# Patient Record
Sex: Female | Born: 1990 | Race: Black or African American | Hispanic: No | Marital: Single | Smoking: Never smoker
Health system: Southern US, Community
[De-identification: ages and names within clinical notes are randomized; demographics above are authoritative.]

## PROBLEM LIST (undated history)

## (undated) DIAGNOSIS — T7840XA Allergy, unspecified, initial encounter: Secondary | ICD-10-CM

## (undated) HISTORY — DX: Allergy, unspecified, initial encounter: T78.40XA

---
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
## 2009-12-18 ENCOUNTER — Ambulatory Visit (HOSPITAL_COMMUNITY): Admission: RE | Admit: 2009-12-18 | Discharge: 2009-12-18 | Payer: Self-pay | Admitting: Family Medicine

## 2011-07-20 IMAGING — US US PELVIS COMPLETE
1 series · 14 of 25 positions shown · non-contrast
Comparison: None.

CLINICAL DATA: 19-year-old patient with irregular and heavy menses.
An attempt at transvaginal imaging was performed, but the patient
reported that she is not sexually active, and as the probe could
not be easily inserted, it was removed.

TRANSABDOMINAL ULTRASOUND OF PELVIS
TECHNIQUE: Transabdominal ultrasound examination of the pelvis was
performed including evaluation of the uterus, ovaries, adnexal
regions, and pelvic cul-de-sac.

[Series 1: us transvaginal non-ob · 0.21mm/px · 14 of 29 slices shown]
[im 1/29]
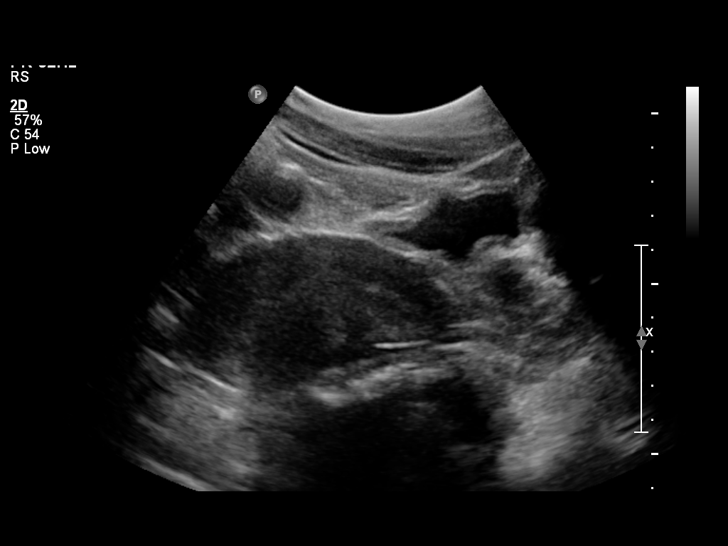
[im 3/29]
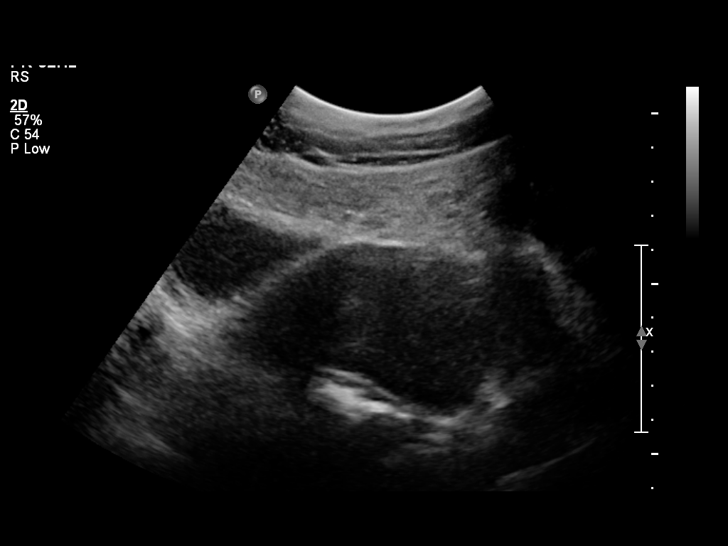
[im 5/29]
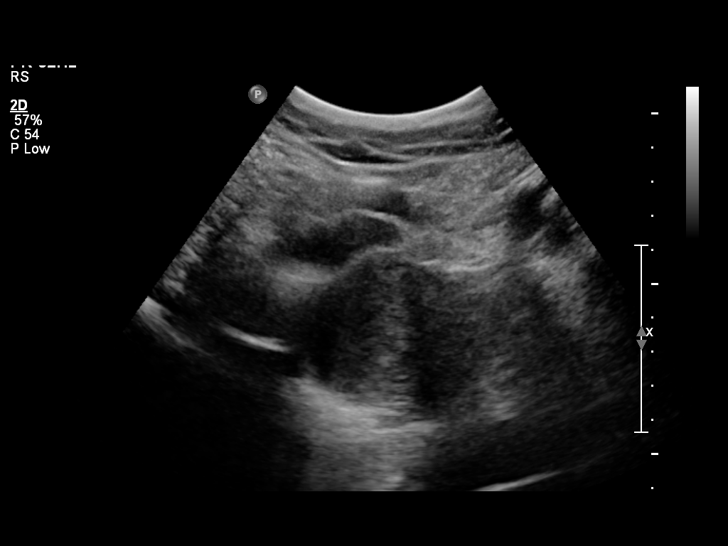
[im 8/29]
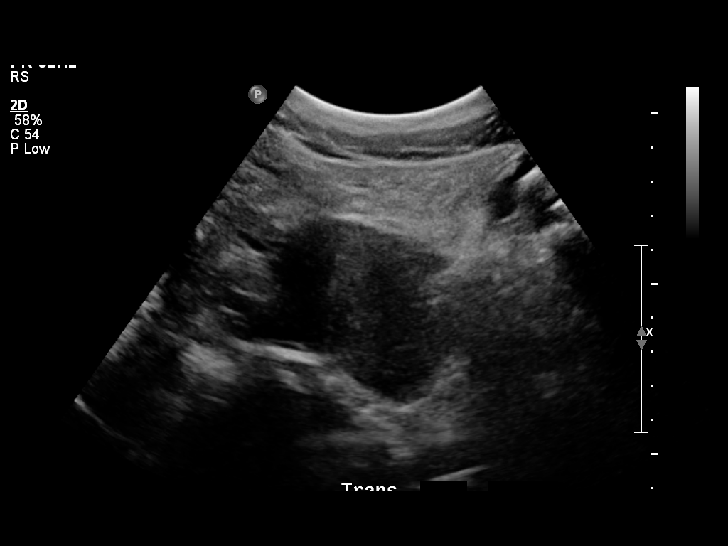
[im 10/29]
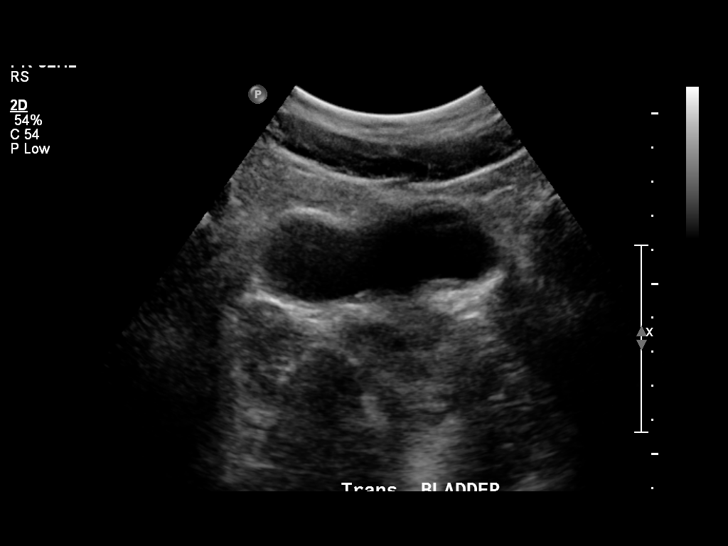
[im 11/29]
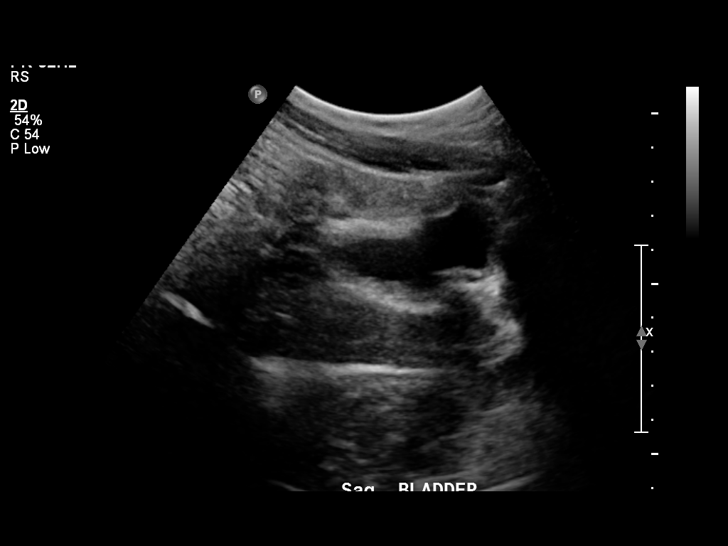
[im 13/29]
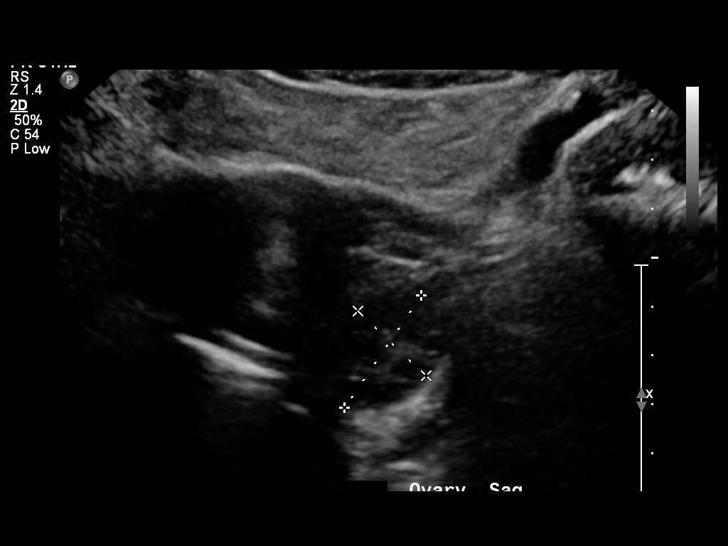
[im 16/29]
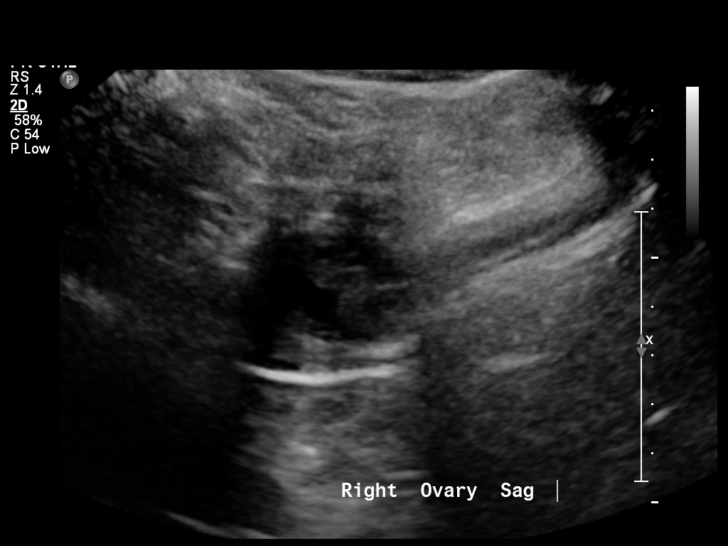
[im 18/29]
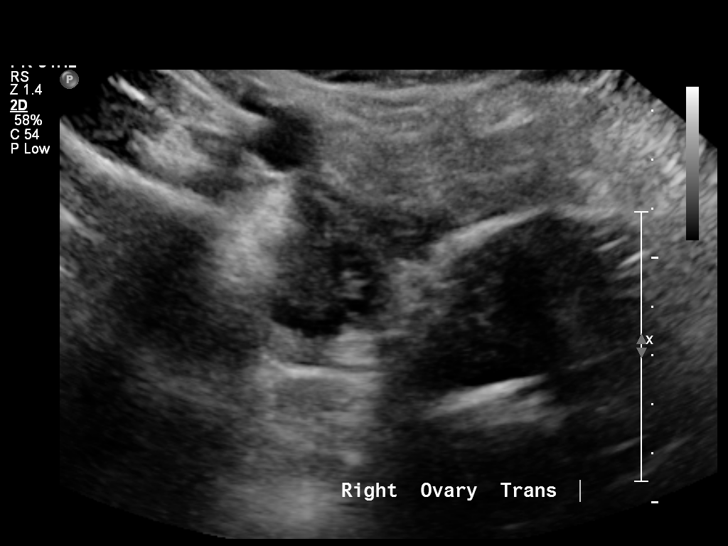
[im 19/29]
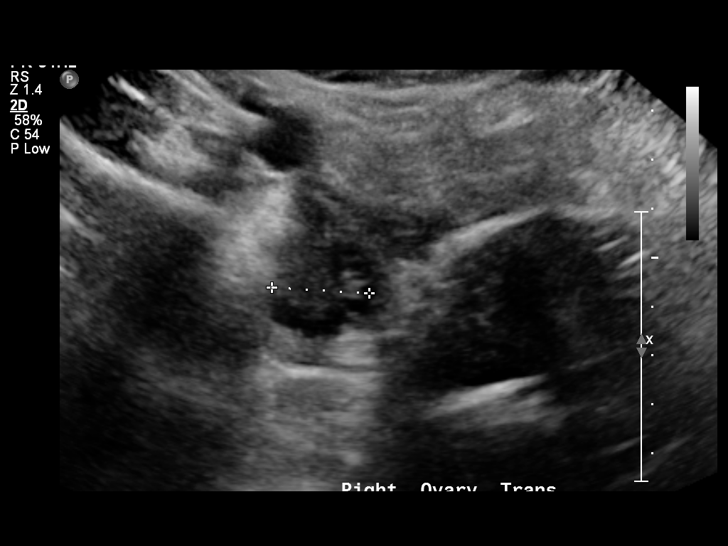
[im 22/29]
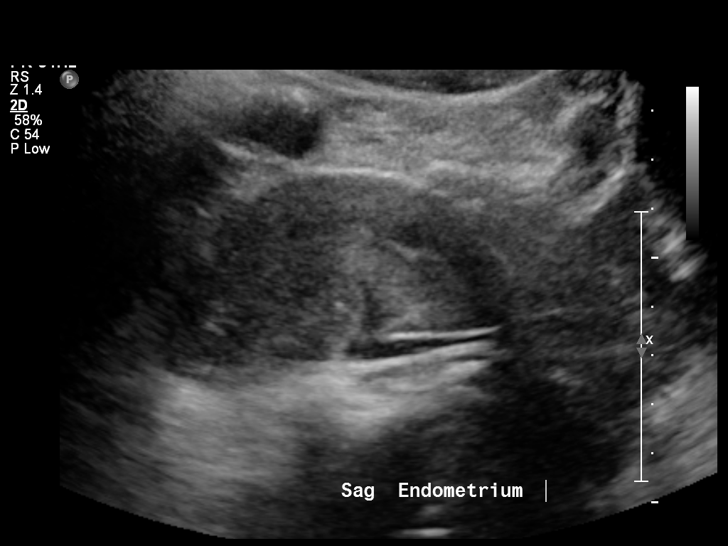
[im 24/29]
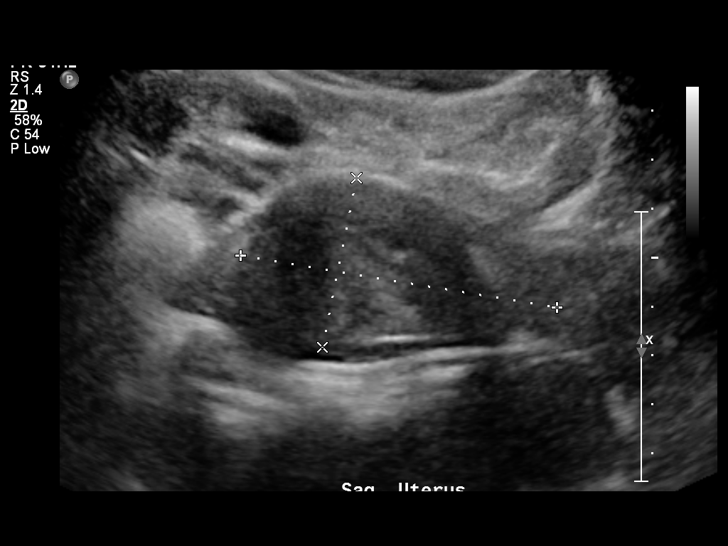
[im 26/29]
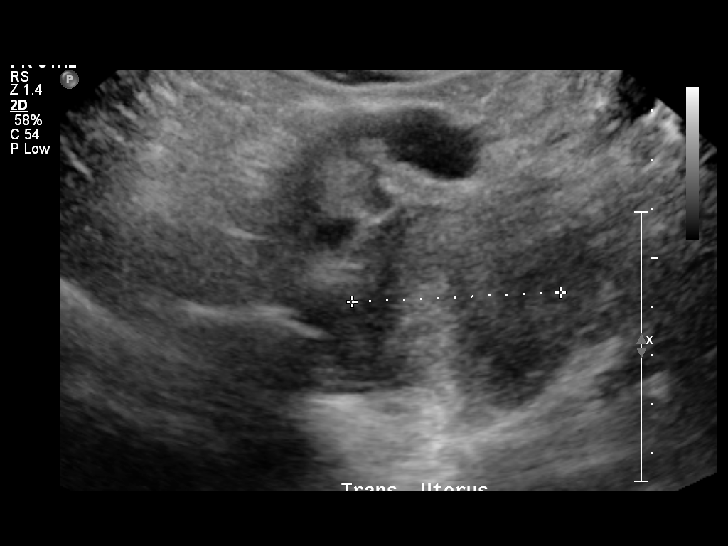
[im 29/29]
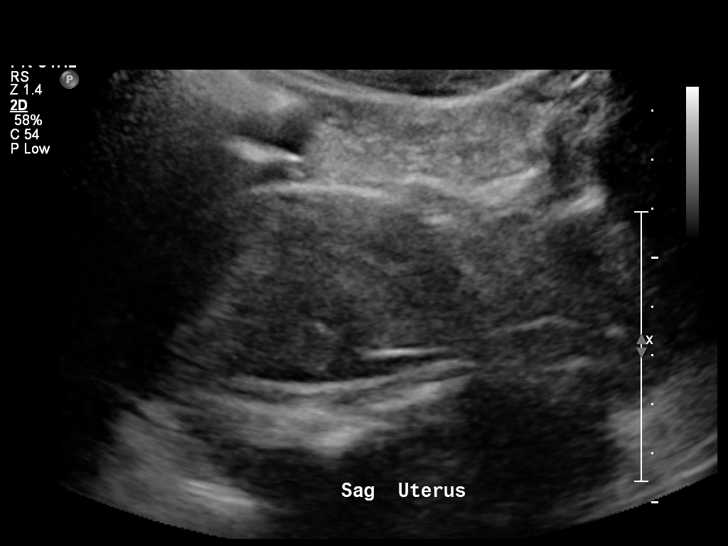

[14 of 25 positions shown; findings below may reference images not displayed]

FINDINGS: Uterus measures 7.8 cm length by 4.4 cm AP diameter by 4.2 cm in
width.  The uterus is retroverted.  No focal uterine mass is
identified.

Endometrium is normal in thickness, measuring 4 to 5 mm.  No
endometrial thickening is identified peri

Right Ovary is normal sonographic appearance and size, measuring
2.9 x 2.2 x 2.0 cm.

Left Ovary is normal in sonographic appearance and size, measuring
2.0 x 1.9 x 2.1 cm.

Other Findings:  No adnexal mass or free fluid is identified.
IMPRESSION: Pelvic ultrasound (performed with transabdominal ultrasound probe)
is within normal limits.

## 2014-03-05 ENCOUNTER — Ambulatory Visit (INDEPENDENT_AMBULATORY_CARE_PROVIDER_SITE_OTHER): Payer: Self-pay | Admitting: Internal Medicine

## 2014-03-05 VITALS — BP 109/68 | HR 84 | Temp 97.6°F | Resp 12 | Ht 65.0 in | Wt 221.1 lb

## 2014-03-05 DIAGNOSIS — J0100 Acute maxillary sinusitis, unspecified: Secondary | ICD-10-CM

## 2014-03-05 MED ORDER — AMOXICILLIN 500 MG PO CAPS: 1000.0000 mg | ORAL_CAPSULE | Freq: Two times a day (BID) | ORAL | Status: AC

## 2014-03-05 MED ORDER — HYDROCODONE-HOMATROPINE 5-1.5 MG/5ML PO SYRP: 5.0000 mL | ORAL_SOLUTION | Freq: Four times a day (QID) | ORAL | Status: DC | PRN

## 2014-03-05 NOTE — Progress Notes (Signed)
   Subjective:  This chart was scribed for Ellamae Siaobert Doolittle, MD by Evon Slackerrance Branch, ED Scribe. This Patient was seen in room 12 and the patients care was started at 8:41 PM   Patient ID: Victoria Marks, female    DOB: [birth year], 24 y.o.   MRN: [id number]  Chief Complaint  Patient presents with  . Generalized Body Aches  . Chills  . Nasal Congestion  . Cough    Cough Associated symptoms include chills, a fever, myalgias and a sore throat.   HPI Comments: Victoria Marks is a 23 y.o. female who presents to the [facility] complaining of  Gradually worsening congestion onset 2 days prior. She states she has associated fever, chills, sore throat, productive cough, and myalgias. She states it is painful to eat due to her sore throat. She denies ear pain.    Review of Systems  Constitutional: Positive for fever and chills.  HENT: Positive for congestion and sore throat.   Respiratory: Positive for cough.   Musculoskeletal: Positive for myalgias.     Objective:   BP 109/68  Pulse 84  Temp(Src) 97.6 F (36.4 C) (Oral)  Resp 12  Ht 5\' 5"  (1.651 m)  Wt 221 lb 2 oz (100.302 kg)  BMI 36.80 kg/m2  SpO2 100%  LMP 02/05/2014   Physical Exam  Nursing note and vitals reviewed. Constitutional: She is oriented to person, place, and time. She appears well-developed and well-nourished. No distress.  HENT:  Head: Normocephalic and atraumatic.  Mouth/Throat: Oropharynx is clear and moist.  Left ear canal clogged with cerumen, purulent nasal discharge.   Eyes: Conjunctivae and EOM are normal.  Neck: Neck supple.  Cardiovascular: Normal rate.   Pulmonary/Chest: Effort normal and breath sounds normal. No respiratory distress.  Chest clear to auscultation.    Musculoskeletal: Normal range of motion.  Neurological: She is alert and oriented to person, place, and time.  Skin: Skin is warm and dry.  Psychiatric: She has a normal mood and affect. Her behavior is normal.       Assessment & Plan:   Sinusitis with cough Meds ordered this encounter  Medications  . amoxicillin (AMOXIL) 500 MG capsule    Sig: Take 2 capsules (1,000 mg total) by mouth 2 (two) times daily.    Dispense:  40 capsule    Refill:  0  . HYDROcodone-homatropine (HYCODAN) 5-1.5 MG/5ML syrup    Sig: Take 5 mLs by mouth every 6 (six) hours as needed.    Dispense:  120 mL    Refill:  0      I have completed the patient encounter in its entirety as documented by the scribe, with editing by me where necessary. Robert P. Merla Richesoolittle, M.D.

## 2014-03-08 ENCOUNTER — Telehealth: Payer: Self-pay

## 2014-03-08 NOTE — Telephone Encounter (Signed)
Called patient to advise. Will leave work note at front desk, Victoria Marks will put it at front. Patient will add tylenol and mucinex for her aches and congestion. She will rest and return to clinic if not better.

## 2014-03-08 NOTE — Telephone Encounter (Signed)
Pt called and LM on VM reporting that she is not feeling any better (about the same as at OV) and continues to have body aches and just not feeling well at all. She wanted to ask about a change in Abx since the Amox hasn't started helping at all yet. She also wanted to have another work note to excuse her from work on Sat.

## 2014-03-08 NOTE — Telephone Encounter (Signed)
Hasn't had time for amox to work F[facility]u Sunday [facility] not [facility] then

## 2014-03-12 ENCOUNTER — Telehealth: Payer: Self-pay

## 2014-03-12 ENCOUNTER — Ambulatory Visit (INDEPENDENT_AMBULATORY_CARE_PROVIDER_SITE_OTHER): Payer: Self-pay | Admitting: Internal Medicine

## 2014-03-12 VITALS — BP 104/60 | HR 101 | Temp 97.7°F | Resp 12 | Ht 62.0 in | Wt 224.2 lb

## 2014-03-12 DIAGNOSIS — R05 Cough: Secondary | ICD-10-CM

## 2014-03-12 DIAGNOSIS — R059 Cough, unspecified: Secondary | ICD-10-CM

## 2014-03-12 MED ORDER — PREDNISONE 20 MG PO TABS: ORAL_TABLET | ORAL | Status: DC

## 2014-03-12 MED ORDER — HYDROCODONE-HOMATROPINE 5-1.5 MG/5ML PO SYRP: 5.0000 mL | ORAL_SOLUTION | Freq: Four times a day (QID) | ORAL | Status: DC | PRN

## 2014-03-12 NOTE — Telephone Encounter (Signed)
Pt needs to RTC- no refill on abx and if still not better pt needs reevaluation.  Spoke to pt she is on her way.

## 2014-03-12 NOTE — Telephone Encounter (Signed)
PT WOULD LIKE TO KNOW IF SHE COULD HAVE A REFILL ON HER ANTIOBIOTICS, PT STILL C/O BODY ACHES, CHILLS, COUGH, FEVER, CONGESTION. PT WOULD ALSO LIKE TO HAVE A NOTE TO BE OUT OF WORK THROUGH 03/14/2014.  BEST# [phone] OR [phone] MOTHERS CELL (PATRICIA HARRIS)

## 2014-03-12 NOTE — Progress Notes (Signed)
   Subjective:  This chart was scribed for Victoria P. Merla Richesoolittle, MD by Victoria Marks, ED Scribe. This patient was seen in room 1 and the patient's care was started at 8:05 PM.     Patient ID: Victoria Marks, female    DOB: [birth year], 23 y.o.   MRN: [id number]  Chief Complaint  Patient presents with  . Cough    Seen last Monday-Not any better  . Nasal Congestion    HPI PCP: Event organiserUser Centricity, MD  HPI Comments: Victoria Marks is a 23 y.o. female, with medical Hx noted below, who presents to the [facility] complaining of constant nasal congestion and intermittent cough and SOB resulting from cough onset 10 days ago. Pt was seen for the same on 03/05/14 and reports her Sx have not improved. Pt denies having asthma as a child.   Past Medical History  Diagnosis Date  . Allergy    Prior to Admission medications   Medication Sig Start Date End Date Taking? Authorizing Provider  amoxicillin (AMOXIL) 500 MG capsule Take 2 capsules (1,000 mg total) by mouth 2 (two) times daily. 03/05/14 03/15/14 Yes Tonye Pearsonobert P Calie Buttrey, MD  HYDROcodone-homatropine [facility](HYCODAN) 5-1.5 MG/5ML syrup Take 5 mLs by mouth every 6 (six) hours as needed. 03/05/14  Yes Tonye Pearsonobert P Sinda Leedom, MD   No Known Allergies  Review of Systems  Constitutional: Negative for fever and chills.  HENT: Positive for congestion.   Respiratory: Positive for cough and shortness of breath (due to cough ).   Psychiatric/Behavioral: Negative for confusion.      Objective:   Physical Exam  Nursing note and vitals reviewed. Constitutional: She is oriented to person, place, and time. She appears well-developed and well-nourished. No distress.  HENT:  Head: Normocephalic and atraumatic.  Clear rhinorrhea; no lymph nodes  Eyes: Conjunctivae and EOM are normal. Pupils are equal, round, and reactive to light.  Neck: Neck supple. No tracheal deviation present.  Cardiovascular: Normal rate.   Pulmonary/Chest: Effort normal. No  respiratory distress. She has wheezes (bilateral wheezing forced expiration ).  Musculoskeletal: Normal range of motion.  Neurological: She is alert and oriented to person, place, and time.  Skin: Skin is warm and dry.  Psychiatric: She has a normal mood and affect. Her behavior is normal.   Filed Vitals:   03/12/14 1938  BP: 104/60  Pulse: 101  Temp: 97.7 F (36.5 C)  Resp: 12      Assessment & Plan:  DIAGNOSTIC STUDIES: Oxygen Saturation is 97% on RA, nl by my interpretation.    COORDINATION OF CARE: 8:08 PM-Discussed treatment plan which includes meds and work note with pt at bedside and pt agreed to plan.   Reactive airway disease s/p sinusitis  To finish amox Meds ordered this encounter  Medications  . HYDROcodone-homatropine (HYCODAN) 5-1.5 MG/5ML syrup    Sig: Take 5 mLs by mouth every 6 (six) hours as needed.    Dispense:  120 mL    Refill:  0  . predniSONE (DELTASONE) 20 MG tablet    Sig: 3/3/2/2/1/1 single daily dose for 6 days    Dispense:  12 tablet    Refill:  0  sudafed 12 hr  I have completed the patient encounter in its entirety as documented by the scribe, with editing by me where necessary. Victoria Beitz P. Merla Marks, M.D.

## 2014-03-13 ENCOUNTER — Telehealth: Payer: Self-pay

## 2014-03-13 NOTE — Telephone Encounter (Signed)
Patient called and states Dr. Merla Richesoolittle gave her some prescriptions but she wants to know if Dr. Merla Richesoolittle can prescribe something for her breathing treatment machine. Patient states Dr. Merla Richesoolittle told her that it seems her congestion had gone into her chest and she is wondering if a breathing treatment will help. CB # [phone]

## 2014-03-14 MED ORDER — ALBUTEROL SULFATE (2.5 MG/3ML) 0.083% IN NEBU: 2.5000 mg | INHALATION_SOLUTION | Freq: Four times a day (QID) | RESPIRATORY_TRACT | Status: AC | PRN

## 2014-03-14 NOTE — Telephone Encounter (Signed)
Pt has a nebulizer and is requesting albuterol for her machine. Please advise.

## 2014-03-14 NOTE — Telephone Encounter (Signed)
Pt notified that albuterol nebules were sent to pharm.

## 2014-03-23 ENCOUNTER — Telehealth: Payer: Self-pay

## 2014-03-23 NOTE — Telephone Encounter (Signed)
Patient is wanting to know if she could get a note to release her back to work.   Best#:  [phone]

## 2014-03-27 NOTE — Telephone Encounter (Signed)
Spoke to pt- she is still not feeling well. She has not returned to work since her last OV.  Advised pt to RTC. She is on her way.

## 2014-03-29 ENCOUNTER — Other Ambulatory Visit: Payer: Self-pay | Admitting: Internal Medicine

## 2014-03-30 ENCOUNTER — Telehealth: Payer: Self-pay | Admitting: *Deleted

## 2014-03-30 NOTE — Telephone Encounter (Signed)
Spoke with patient she is not feeling any better, going to come in 11/13 for a follow up.

## 2014-04-01 ENCOUNTER — Emergency Department (HOSPITAL_COMMUNITY)
Admission: EM | Admit: 2014-04-01 | Discharge: 2014-04-02 | Disposition: A | Discharge status: Home / Self Care | Payer: Self-pay | Attending: Emergency Medicine | Admitting: Emergency Medicine

## 2014-04-01 ENCOUNTER — Encounter (HOSPITAL_COMMUNITY): Payer: Self-pay | Admitting: *Deleted

## 2014-04-01 DIAGNOSIS — J209 Acute bronchitis, unspecified: Secondary | ICD-10-CM | POA: Insufficient documentation

## 2014-04-01 DIAGNOSIS — Z79899 Other long term (current) drug therapy: Secondary | ICD-10-CM | POA: Insufficient documentation

## 2014-04-01 DIAGNOSIS — Z7952 Long term (current) use of systemic steroids: Secondary | ICD-10-CM | POA: Insufficient documentation

## 2014-04-01 DIAGNOSIS — Z792 Long term (current) use of antibiotics: Secondary | ICD-10-CM | POA: Insufficient documentation

## 2014-04-01 DIAGNOSIS — J40 Bronchitis, not specified as acute or chronic: Secondary | ICD-10-CM

## 2014-04-01 NOTE — ED Notes (Signed)
Pt states that she has had cough, congestion and shortness of breath since 10/20; pt has been seen at the [facility] twice and was diagnosed with sinusitis; pt states that she has been on Amoxicillin, breathing treatments and prednisone; pt states that it feels like she has pneumonia like she did in April; pt states that the symptoms have not improved after being seen at the UC twice

## 2014-04-01 NOTE — ED Provider Notes (Signed)
CSN: [id number]     Arrival date & time 04/01/14  2310 History   First MD Initiated Contact with Patient 04/01/14 2342     This chart was scribed for non-physician practitioner, Elpidio AnisShari Oris Staffieri PA-C, working with Olivia Mackielga M Otter, MD by Arlan OrganAshley Leger, ED Scribe. This patient was seen in room WA03/WA03 and the patient's care was started at 11:49 PM.   Chief Complaint  Patient presents with  . URI   The history is provided by the patient. No language interpreter was used.    HPI Comments: Victoria Marks is a 23 y.o. female who presents to the Emergency Department complaining of a possible upper respiratory infection x 1 month that is unchanged. She reports cough, congestion, and SOB. Pt evaluated at [facility] twice and diagnosed with sinusitis. She was sent home on course of Amoxicillin (current on second round), 6 day taper Prednisone (completed last week), and was given breathing treatments during visits without improvement for symptoms. She has also tried OTC cough suppressant without any relief. [name] is concerned for possible pneumonia and recovered from a case back in April of this year. No recent vomiting. Pt works at [employer] and is constantly around people. No history of asthma or DM. Tetanus UTD. No known allergies to medications. She has no pertinent past medical history. No other concerns this visit.    Past Medical History  Diagnosis Date  . Allergy    History reviewed. No pertinent past surgical history. No family history on file. History  Substance Use Topics  . Smoking status: Never Smoker   . Smokeless tobacco: Never Used  . Alcohol Use: No   OB History    No data available     Review of Systems  HENT: Positive for congestion.   Respiratory: Positive for cough and shortness of breath.   Gastrointestinal: Negative for vomiting.  All other systems reviewed and are negative.     Allergies  Review of patient's allergies indicates no known allergies.  Home  Medications   Prior to Admission medications   Medication Sig Start Date End Date Taking? Authorizing Provider  albuterol (PROVENTIL) (2.5 MG/3ML) 0.083% nebulizer solution Take 3 mLs (2.5 mg total) by nebulization every 6 (six) hours as needed for wheezing or shortness of breath. 03/14/14   [name], MD  HYDROcodone-homatropine [facility](HYCODAN) 5-1.5 MG/5ML syrup Take 5 mLs by mouth every 6 (six) hours as needed. 03/12/14   [name], MD  predniSONE (DELTASONE) 20 MG tablet 3/3/2/2/1/1 single daily dose for 6 days 03/12/14   [name], MD   Triage Vitals: BP 127/66 mmHg  Pulse 96  Temp(Src) 97.8 F (36.6 C) (Oral)  Resp 19  Ht 5\' 5"  (1.651 m)  Wt 221 lb 12.8 oz (100.608 kg)  BMI 36.91 kg/m2  SpO2 98%  LMP 03/27/2014   Physical Exam  Constitutional: She is oriented to person, place, and time. She appears well-developed and well-nourished.  HENT:  Head: Normocephalic.  Nasal mucous swollen; R greater than L Oral pharynx beign  Eyes: EOM are normal.  Neck: Normal range of motion.  Pulmonary/Chest: Effort normal and breath sounds normal.  Abdominal: She exhibits no distension.  Musculoskeletal: Normal range of motion.  Neurological: She is alert and oriented to person, place, and time.  Psychiatric: She has a normal mood and affect.  Nursing note and vitals reviewed.   ED Course  Procedures (including critical care time)  DIAGNOSTIC STUDIES: Oxygen Saturation is 98% on RA, Normal  by my interpretation.    COORDINATION OF CARE: 11:49 PM- Will start on different antibiotic and give second course of steroids. Discussed treatment plan with pt at bedside and pt agreed to plan.     Labs Review Labs Reviewed - No data to display  Imaging Review No results found.   EKG Interpretation None      MDM   Final diagnoses:  None    1. Bronchitis  Prolonged symptoms of URI w/cough for over 3 weeks. Will change abx to zithromax and provide 12-day taper  dosing of steroids. Outpatient primary care referral provided. She is well appearing, no hypoxia, VSS.   I personally performed the services described in this documentation, which was scribed in my presence. The recorded information has been reviewed and is accurate.    Arnoldo HookerShari A Madyn Ivins, PA-C 04/02/14 0125  Olivia Mackielga M Otter, MD 04/02/14 [phone]

## 2014-04-02 MED ORDER — AZITHROMYCIN 250 MG PO TABS
500.0000 mg | ORAL_TABLET | Freq: Once | ORAL | Status: AC
  Administered 2014-04-02: 500 mg via ORAL
  Filled 2014-04-02: qty 2

## 2014-04-02 MED ORDER — PREDNISONE 10 MG PO TABS: ORAL_TABLET | ORAL | Status: DC

## 2014-04-02 MED ORDER — AZITHROMYCIN 250 MG PO TABS: 250.0000 mg | ORAL_TABLET | Freq: Every day | ORAL | Status: DC

## 2014-04-02 MED ORDER — PREDNISONE 20 MG PO TABS
60.0000 mg | ORAL_TABLET | Freq: Once | ORAL | Status: AC
Start: 2014-04-02 — End: 2014-04-02
  Administered 2014-04-02: 60 mg via ORAL
  Filled 2014-04-02: qty 3

## 2014-04-02 NOTE — Discharge Instructions (Signed)
Cough, Adult  A cough is a reflex that helps clear your throat and airways. It can help heal the body or may be a reaction to an irritated airway. A cough may only last 2 or 3 weeks (acute) or may last more than 8 weeks (chronic).  CAUSES Acute cough:  Viral or bacterial infections. Chronic cough:  Infections.  Allergies.  Asthma.  Post-nasal drip.  Smoking.  Heartburn or acid reflux.  Some medicines.  Chronic lung problems (COPD).  Cancer. SYMPTOMS   Cough.  Fever.  Chest pain.  Increased breathing rate.  High-pitched whistling sound when breathing (wheezing).  Colored mucus that you cough up (sputum). TREATMENT   A bacterial cough may be treated with antibiotic medicine.  A viral cough must run its course and will not respond to antibiotics.  Your caregiver may recommend other treatments if you have a chronic cough. HOME CARE INSTRUCTIONS   Only take over-the-counter or prescription medicines for pain, discomfort, or fever as directed by your caregiver. Use cough suppressants only as directed by your caregiver.  Use a cold steam vaporizer or humidifier in your bedroom or home to help loosen secretions.  Sleep in a semi-upright position if your cough is worse at night.  Rest as needed.  Stop smoking if you smoke. SEEK IMMEDIATE MEDICAL CARE IF:   You have pus in your sputum.  Your cough starts to worsen.  You cannot control your cough with suppressants and are losing sleep.  You begin coughing up blood.  You have difficulty breathing.  You develop pain which is getting worse or is uncontrolled with medicine.  You have a fever. MAKE SURE YOU:   Understand these instructions.  Will watch your condition.  Will get help right away if you are not doing well or get worse. Document Released: 10/31/2010 Document Revised: 07/27/2011 Document Reviewed: 10/31/2010 Exit[employer] Patient Information 2015 [employer]. This information is not intended  to replace advice given to you by your health care provider. Make sure you discuss any questions you have with your health care provider.  

## 2014-04-07 NOTE — ED Notes (Signed)
Chart opened due to patients request for another work note.

## 2014-04-18 ENCOUNTER — Telehealth (HOSPITAL_BASED_OUTPATIENT_CLINIC_OR_DEPARTMENT_OTHER): Payer: Self-pay | Admitting: Emergency Medicine

## 2014-05-02 ENCOUNTER — Emergency Department (HOSPITAL_COMMUNITY)
Admission: EM | Admit: 2014-05-02 | Discharge: 2014-05-03 | Disposition: A | Discharge status: Home / Self Care | Payer: Managed Care, Other (non HMO) | Attending: Emergency Medicine | Admitting: Emergency Medicine

## 2014-05-02 ENCOUNTER — Encounter (HOSPITAL_COMMUNITY): Payer: Self-pay | Admitting: Emergency Medicine

## 2014-05-02 DIAGNOSIS — J069 Acute upper respiratory infection, unspecified: Secondary | ICD-10-CM | POA: Insufficient documentation

## 2014-05-02 DIAGNOSIS — Z79899 Other long term (current) drug therapy: Secondary | ICD-10-CM | POA: Insufficient documentation

## 2014-05-02 DIAGNOSIS — Z880 Allergy status to penicillin: Secondary | ICD-10-CM | POA: Insufficient documentation

## 2014-05-02 DIAGNOSIS — R05 Cough: Secondary | ICD-10-CM

## 2014-05-02 DIAGNOSIS — Z7952 Long term (current) use of systemic steroids: Secondary | ICD-10-CM | POA: Diagnosis not present

## 2014-05-02 DIAGNOSIS — R059 Cough, unspecified: Secondary | ICD-10-CM

## 2014-05-02 NOTE — ED Notes (Addendum)
Pt presents with cough intermittent since October. Pt finished medication now has productive cough with yellow sputum, pt reports upper chest discomfort worse with cough. Pt states she has been seen several times for same.

## 2014-05-03 ENCOUNTER — Emergency Department (HOSPITAL_COMMUNITY): Payer: Managed Care, Other (non HMO)

## 2014-05-03 ENCOUNTER — Encounter (HOSPITAL_BASED_OUTPATIENT_CLINIC_OR_DEPARTMENT_OTHER): Payer: Self-pay | Admitting: Emergency Medicine

## 2014-05-03 MED ORDER — BENZONATATE 100 MG PO CAPS: 100.0000 mg | ORAL_CAPSULE | Freq: Three times a day (TID) | ORAL | Status: DC | PRN

## 2014-05-03 NOTE — ED Provider Notes (Signed)
CSN: [id number]     Arrival date & time 05/02/14  2120 History   First MD Initiated Contact with Patient 05/03/14 0017     Chief Complaint  Patient presents with  . Cough     (Consider location/radiation/quality/duration/timing/severity/associated sxs/prior Treatment) Patient is a 23 y.o. female presenting with cough. The history is provided by the patient.  Cough Associated symptoms: no chest pain, no chills, no fever, no headaches, no rash, no shortness of breath and no sore throat   pt c/o non-productive cough in the past few days. Episodic, persistent. Moderate. Denies specific exacerbating or alleviating factors. States during hard coughing spell will have chest wall soreness. No other episodic cp, no exertional cp. No associated sob, nv or diaphoresis. No pleuritic cp. States had congestion and runny nose, but that has resolved. No sore throat. No abd pain or nvd. No myalgias or headaches. No fever or chills. No known ill contacts. No hx asthma. Non smoker.    Past Medical History  Diagnosis Date  . Allergy    History reviewed. No pertinent past surgical history. No family history on file. History  Substance Use Topics  . Smoking status: Never Smoker   . Smokeless tobacco: Never Used  . Alcohol Use: No   OB History    No data available     Review of Systems  Constitutional: Negative for fever and chills.  HENT: Negative for sore throat.   Eyes: Negative for redness.  Respiratory: Positive for cough. Negative for shortness of breath.   Cardiovascular: Negative for chest pain, palpitations and leg swelling.  Gastrointestinal: Negative for vomiting, abdominal pain and diarrhea.  Genitourinary: Negative for flank pain.  Musculoskeletal: Negative for back pain and neck pain.  Skin: Negative for rash.  Neurological: Negative for headaches.  Hematological: Does not bruise/bleed easily.  Psychiatric/Behavioral: Negative for confusion.      Allergies   Amoxicillin  Home Medications   Prior to Admission medications   Medication Sig Start Date End Date Taking? Authorizing Provider  albuterol (PROVENTIL) (2.5 MG/3ML) 0.083% nebulizer solution Take 3 mLs (2.5 mg total) by nebulization every 6 (six) hours as needed for wheezing or shortness of breath. 03/14/14  Yes [name], MD  ibuprofen (ADVIL,MOTRIN) 200 MG tablet Take 400 mg by mouth every 6 (six) hours as needed for moderate pain.   Yes Historical Provider, MD  Multiple Vitamin (MULTIVITAMIN WITH MINERALS) TABS tablet Take 1 tablet by mouth daily.   Yes Historical Provider, MD  pseudoephedrine (SUDAFED) 30 MG tablet Take 30 mg by mouth every 4 (four) hours as needed for congestion.   Yes Historical Provider, MD  azithromycin (ZITHROMAX Z-PAK) 250 MG tablet Take 1 tablet (250 mg total) by mouth daily. Patient not taking: Reported on 05/03/2014 04/02/14   Melvenia BeamShari A Upstill, PA-C  HYDROcodone-homatropine (HYCODAN) 5-1.5 MG/5ML syrup Take 5 mLs by mouth every 6 (six) hours as needed. Patient not taking: Reported on 05/03/2014 03/12/14   [name], MD  predniSONE (DELTASONE) 10 MG tablet Take 6 tablets day 2 (day 1 given in emergency dept.) Take 5 tablets days 3 and 4 Take 4 tablets days 5 and 6 Take 3 tablets days 7 and 8 Take 2 tablets days 9 and 10 Take 1 tablet days 11 and 12 Patient not taking: Reported on 05/03/2014 04/02/14   Melvenia BeamShari A Upstill, PA-C   BP 111/62 mmHg  Pulse 83  Temp(Src) 97.7 F (36.5 C) (Oral)  Resp 14  Ht 5\' 5"  (1.651 m)  Wt 215 lb (97.523 kg)  BMI 35.78 kg/m2  SpO2 99%  LMP 04/14/2014 Physical Exam  Constitutional: She is oriented to person, place, and time. She appears well-developed and well-nourished. No distress.  HENT:  Nose: Nose normal.  Mouth/Throat: Oropharynx is clear and moist.  Eyes: Conjunctivae are normal. No scleral icterus.  Neck: Neck supple. No JVD present. No tracheal deviation present.  Cardiovascular: Normal rate,  regular rhythm, normal heart sounds and intact distal pulses.  Exam reveals no gallop and no friction rub.   No murmur heard. Pulmonary/Chest: Effort normal and breath sounds normal. No respiratory distress. She exhibits tenderness.  Abdominal: Soft. Normal appearance and bowel sounds are normal. She exhibits no distension and no mass. There is no tenderness. There is no rebound and no guarding.  Musculoskeletal: She exhibits no edema or tenderness.  Lymphadenopathy:    She has no cervical adenopathy.  Neurological: She is alert and oriented to person, place, and time.  Skin: Skin is warm and dry. No rash noted. She is not diaphoretic.  Psychiatric: She has a normal mood and affect.  Nursing note and vitals reviewed.   ED Course  Procedures (including critical care time) Labs Review   Dg Chest 2 View  05/03/2014   CLINICAL DATA:  Initial evaluation for intermittent cough.  EXAM: CHEST  2 VIEW  COMPARISON:  Prior study from 08/09/2012  FINDINGS: The cardiac and mediastinal silhouettes are stable in size and contour, and remain within normal limits.  The lungs are normally inflated. No airspace consolidation, pleural effusion, or pulmonary edema is identified. There is no pneumothorax.  No acute osseous abnormality identified.  IMPRESSION: No active cardiopulmonary disease.   Electronically Signed   By: Rise MuBenjamin  McClintock M.D.   On: 05/03/2014 01:55      MDM   Cxr.  Reviewed nursing notes and prior charts for additional history.   Recheck no increased wob. Afeb.  Pt stable for d/c.     Suzi RootsKevin E Denton Derks, MD 05/03/14 [phone]

## 2014-05-03 NOTE — Discharge Instructions (Signed)
It was our pleasure to provide your ER care today - we hope that you feel better.  You may try taking tessalon as need for cough.  Follow up with primary care doctor in 1 week if symptoms fail to improve/resolve.  Return to ER if worse, new symptoms, high fevers, trouble breathing, other concern.        Cough, Adult  A cough is a reflex that helps clear your throat and airways. It can help heal the body or may be a reaction to an irritated airway. A cough may only last 2 or 3 weeks (acute) or may last more than 8 weeks (chronic).  CAUSES Acute cough:  Viral or bacterial infections. Chronic cough:  Infections.  Allergies.  Asthma.  Post-nasal drip.  Smoking.  Heartburn or acid reflux.  Some medicines.  Chronic lung problems (COPD).  Cancer. SYMPTOMS   Cough.  Fever.  Chest pain.  Increased breathing rate.  High-pitched whistling sound when breathing (wheezing).  Colored mucus that you cough up (sputum). TREATMENT   A bacterial cough may be treated with antibiotic medicine.  A viral cough must run its course and will not respond to antibiotics.  Your caregiver may recommend other treatments if you have a chronic cough. HOME CARE INSTRUCTIONS   Only take over-the-counter or prescription medicines for pain, discomfort, or fever as directed by your caregiver. Use cough suppressants only as directed by your caregiver.  Use a cold steam vaporizer or humidifier in your bedroom or home to help loosen secretions.  Sleep in a semi-upright position if your cough is worse at night.  Rest as needed.  Stop smoking if you smoke. SEEK IMMEDIATE MEDICAL CARE IF:   You have pus in your sputum.  Your cough starts to worsen.  You cannot control your cough with suppressants and are losing sleep.  You begin coughing up blood.  You have difficulty breathing.  You develop pain which is getting worse or is uncontrolled with medicine.  You have a fever. MAKE  SURE YOU:   Understand these instructions.  Will watch your condition.  Will get help right away if you are not doing well or get worse. Document Released: 10/31/2010 Document Revised: 07/27/2011 Document Reviewed: 10/31/2010 [facility] Patient Information 2015 [place], [place]. This information is not intended to replace advice given to you by your health care provider. Make sure you discuss any questions you have with your health care provider.     Upper Respiratory Infection, Adult An upper respiratory infection (URI) is also known as the common cold. It is often caused by a type of germ (virus). Colds are easily spread (contagious). You can pass it to others by kissing, coughing, sneezing, or drinking out of the same glass. Usually, you get better in 1 or 2 weeks.  HOME CARE   Only take medicine as told by your doctor.  Use a warm mist humidifier or breathe in steam from a hot shower.  Drink enough water and fluids to keep your pee (urine) clear or pale yellow.  Get plenty of rest.  Return to work when your temperature is back to normal or as told by your doctor. You may use a face mask and wash your hands to stop your cold from spreading. GET HELP RIGHT AWAY IF:   After the first few days, you feel you are getting worse.  You have questions about your medicine.  You have chills, shortness of breath, or brown or red spit (mucus).  You have  yellow or brown snot (nasal discharge) or pain in the face, especially when you bend forward.  You have a fever, puffy (swollen) neck, pain when you swallow, or white spots in the back of your throat.  You have a bad headache, ear pain, sinus pain, or chest pain.  You have a high-pitched whistling sound when you breathe in and out (wheezing).  You have a lasting cough or cough up blood.  You have sore muscles or a stiff neck. MAKE SURE YOU:   Understand these instructions.  Will watch your condition.  Will get help right away if  you are not doing well or get worse. Document Released: 10/21/2007 Document Revised: 07/27/2011 Document Reviewed: 08/09/2013 [facility] Patient Information 2015 [place], [place]. This information is not intended to replace advice given to you by your health care provider. Make sure you discuss any questions you have with your health care provider.

## 2015-02-02 ENCOUNTER — Encounter (HOSPITAL_COMMUNITY): Payer: Self-pay | Admitting: Emergency Medicine

## 2015-02-02 ENCOUNTER — Emergency Department (HOSPITAL_COMMUNITY)
Admission: EM | Admit: 2015-02-02 | Discharge: 2015-02-02 | Disposition: A | Discharge status: Home / Self Care | Payer: Managed Care, Other (non HMO) | Attending: Emergency Medicine | Admitting: Emergency Medicine

## 2015-02-02 DIAGNOSIS — Z880 Allergy status to penicillin: Secondary | ICD-10-CM | POA: Insufficient documentation

## 2015-02-02 DIAGNOSIS — W208XXA Other cause of strike by thrown, projected or falling object, initial encounter: Secondary | ICD-10-CM | POA: Insufficient documentation

## 2015-02-02 DIAGNOSIS — Y92810 Car as the place of occurrence of the external cause: Secondary | ICD-10-CM | POA: Insufficient documentation

## 2015-02-02 DIAGNOSIS — S0101XA Laceration without foreign body of scalp, initial encounter: Secondary | ICD-10-CM | POA: Insufficient documentation

## 2015-02-02 DIAGNOSIS — Y9389 Activity, other specified: Secondary | ICD-10-CM | POA: Insufficient documentation

## 2015-02-02 DIAGNOSIS — Y998 Other external cause status: Secondary | ICD-10-CM | POA: Insufficient documentation

## 2015-02-02 DIAGNOSIS — Z79899 Other long term (current) drug therapy: Secondary | ICD-10-CM | POA: Insufficient documentation

## 2015-02-02 MED ORDER — LIDOCAINE-EPINEPHRINE-TETRACAINE (LET) SOLUTION
3.0000 mL | Freq: Once | NASAL | Status: AC
  Administered 2015-02-02: 3 mL via TOPICAL
  Filled 2015-02-02: qty 3

## 2015-02-02 NOTE — Discharge Instructions (Signed)
Laceration Care, Adult °A laceration is a cut or lesion that goes through all layers of the skin and into the tissue just beneath the skin. °TREATMENT  °Some lacerations may not require closure. Some lacerations may not be able to be closed due to an increased risk of infection. It is important to see your caregiver as soon as possible after an injury to minimize the risk of infection and maximize the opportunity for successful closure. °If closure is appropriate, pain medicines may be given, if needed. The wound will be cleaned to help prevent infection. Your caregiver will use stitches (sutures), staples, wound glue (adhesive), or skin adhesive strips to repair the laceration. These tools bring the skin edges together to allow for faster healing and a better cosmetic outcome. However, all wounds will heal with a scar. Once the wound has healed, scarring can be minimized by covering the wound with sunscreen during the day for 1 full year. °HOME CARE INSTRUCTIONS  °For sutures or staples: °· Keep the wound clean and dry. °· If you were given a bandage (dressing), you should change it at least once a day. Also, change the dressing if it becomes wet or dirty, or as directed by your caregiver. °· Wash the wound with soap and water 2 times a day. Rinse the wound off with water to remove all soap. Pat the wound dry with a clean towel. °· After cleaning, apply a thin layer of the antibiotic ointment as recommended by your caregiver. This will help prevent infection and keep the dressing from sticking. °· You may shower as usual after the first 24 hours. Do not soak the wound in water until the sutures are removed. °· Only take over-the-counter or prescription medicines for pain, discomfort, or fever as directed by your caregiver. °· Get your sutures or staples removed as directed by your caregiver. °For skin adhesive strips: °· Keep the wound clean and dry. °· Do not get the skin adhesive strips wet. You may bathe  carefully, using caution to keep the wound dry. °· If the wound gets wet, pat it dry with a clean towel. °· Skin adhesive strips will fall off on their own. You may trim the strips as the wound heals. Do not remove skin adhesive strips that are still stuck to the wound. They will fall off in time. °For wound adhesive: °· You may briefly wet your wound in the shower or bath. Do not soak or scrub the wound. Do not swim. Avoid periods of heavy perspiration until the skin adhesive has fallen off on its own. After showering or bathing, gently pat the wound dry with a clean towel. °· Do not apply liquid medicine, cream medicine, or ointment medicine to your wound while the skin adhesive is in place. This may loosen the film before your wound is healed. °· If a dressing is placed over the wound, be careful not to apply tape directly over the skin adhesive. This may cause the adhesive to be pulled off before the wound is healed. °· Avoid prolonged exposure to sunlight or tanning lamps while the skin adhesive is in place. Exposure to ultraviolet light in the first year will darken the scar. °· The skin adhesive will usually remain in place for 5 to 10 days, then naturally fall off the skin. Do not pick at the adhesive film. °You may need a tetanus shot if: °· You cannot remember when you had your last tetanus shot. °· You have never had a tetanus   shot. °If you get a tetanus shot, your arm may swell, get red, and feel warm to the touch. This is common and not a problem. If you need a tetanus shot and you choose not to have one, there is a rare chance of getting tetanus. Sickness from tetanus can be serious. °SEEK MEDICAL CARE IF:  °· You have redness, swelling, or increasing pain in the wound. °· You see a red line that goes away from the wound. °· You have yellowish-white fluid (pus) coming from the wound. °· You have a fever. °· You notice a bad smell coming from the wound or dressing. °· Your wound breaks open before or  after sutures have been removed. °· You notice something coming out of the wound such as wood or glass. °· Your wound is on your hand or foot and you cannot move a finger or toe. °SEEK IMMEDIATE MEDICAL CARE IF:  °· Your pain is not controlled with prescribed medicine. °· You have severe swelling around the wound causing pain and numbness or a change in color in your arm, hand, leg, or foot. °· Your wound splits open and starts bleeding. °· You have worsening numbness, weakness, or loss of function of any joint around or beyond the wound. °· You develop painful lumps near the wound or on the skin anywhere on your body. °MAKE SURE YOU:  °· Understand these instructions. °· Will watch your condition. °· Will get help right away if you are not doing well or get worse. °Document Released: 05/04/2005 Document Revised: 07/27/2011 Document Reviewed: 10/28/2010 °ExitCare® Patient Information ©2015 [employer], [employer]. This information is not intended to replace advice given to you by your health care provider. Make sure you discuss any questions you have with your health care provider. ° ° °Head Injury °You have received a head injury. It does not appear serious at this time. Headaches and vomiting are common following head injury. It should be easy to awaken from sleeping. Sometimes it is necessary for you to stay in the emergency department for a while for observation. Sometimes admission to the hospital may be needed. After injuries such as yours, most problems occur within the first 24 hours, but side effects may occur up to 7-10 days after the injury. It is important for you to carefully monitor your condition and contact your health care provider or seek immediate medical care if there is a change in your condition. °WHAT ARE THE TYPES OF HEAD INJURIES? °Head injuries can be as minor as a bump. Some head injuries can be more severe. More severe head injuries include: °· A jarring injury to the brain (concussion). °· A  bruise of the brain (contusion). This mean there is bleeding in the brain that can cause swelling. °· A cracked skull (skull fracture). °· Bleeding in the brain that collects, clots, and forms a bump (hematoma). °WHAT CAUSES A HEAD INJURY? °A serious head injury is most likely to happen to someone who is in a car wreck and is not wearing a seat belt. Other causes of major head injuries include bicycle or motorcycle accidents, sports injuries, and falls. °HOW ARE HEAD INJURIES DIAGNOSED? °A complete history of the event leading to the injury and your current symptoms will be helpful in diagnosing head injuries. Many times, pictures of the brain, such as CT or MRI are needed to see the extent of the injury. Often, an overnight hospital stay is necessary for observation.  °WHEN SHOULD I SEEK IMMEDIATE MEDICAL CARE?  °  You should get help right away if: °· You have confusion or drowsiness. °· You feel sick to your stomach (nauseous) or have continued, forceful vomiting. °· You have dizziness or unsteadiness that is getting worse. °· You have severe, continued headaches not relieved by medicine. Only take over-the-counter or prescription medicines for pain, fever, or discomfort as directed by your health care provider. °· You do not have normal function of the arms or legs or are unable to walk. °· You notice changes in the black spots in the center of the colored part of your eye (pupil). °· You have a clear or bloody fluid coming from your nose or ears. °· You have a loss of vision. °During the next 24 hours after the injury, you must stay with someone who can watch you for the warning signs. This person should contact local emergency services (911 in the [place] if you have seizures, you become unconscious, or you are unable to wake up. °HOW CAN I PREVENT A HEAD INJURY IN THE FUTURE? °The most important factor for preventing major head injuries is avoiding motor vehicle accidents.  To minimize the potential for damage to  your head, it is crucial to wear seat belts while riding in motor vehicles. Wearing helmets while bike riding and playing collision sports (like football) is also helpful. Also, avoiding dangerous activities around the house will further help reduce your risk of head injury.  °WHEN CAN I RETURN TO NORMAL ACTIVITIES AND ATHLETICS? °You should be reevaluated by your health care provider before returning to these activities. If you have any of the following symptoms, you should not return to activities or contact sports until 1 week after the symptoms have stopped: °· Persistent headache. °· Dizziness or vertigo. °· Poor attention and concentration. °· Confusion. °· Memory problems. °· Nausea or vomiting. °· Fatigue or tire easily. °· Irritability. °· Intolerant of bright lights or loud noises. °· Anxiety or depression. °· Disturbed sleep. °MAKE SURE YOU:  °· Understand these instructions. °· Will watch your condition. °· Will get help right away if you are not doing well or get worse. °Document Released: 05/04/2005 Document Revised: 05/09/2013 Document Reviewed: 01/09/2013 °ExitCare® Patient Information ©2015 [employer]. This information is not intended to replace advice given to you by your health care provider. Make sure you discuss any questions you have with your health care provider. ° °

## 2015-02-02 NOTE — ED Notes (Signed)
Per patient, hood of car fell on top of head, small laceration, no LOC

## 2015-02-02 NOTE — ED Provider Notes (Signed)
CSN: [id number]     Arrival date & time 02/02/15  0846 History   First MD Initiated Contact with Patient 02/02/15 [id number]     Chief Complaint  Patient presents with  . Head Laceration     (Consider location/radiation/quality/duration/timing/severity/associated sxs/prior Treatment) HPI Comments: Patient presents with a laceration. She states she was helping someone with his car and the car hood fell on top of her head. There is no loss of consciousness. She's had some mild lightheadedness the incident but otherwise feels fine. She has no nausea vomiting. No vision loss. No ataxia. Her tetanus shot was 2 years ago. She denies any neck or back pain. There is no other injuries.  Patient is a 24 y.o. female presenting with scalp laceration.  Head Laceration Pertinent negatives include no headaches.    Past Medical History  Diagnosis Date  . Allergy    History reviewed. No pertinent past surgical history. No family history on file. Social History  Substance Use Topics  . Smoking status: Never Smoker   . Smokeless tobacco: Never Used  . Alcohol Use: No   OB History    No data available     Review of Systems  Constitutional: Negative for fever.  Eyes: Negative for visual disturbance.  Gastrointestinal: Negative for nausea and vomiting.  Musculoskeletal: Negative for back pain, joint swelling, arthralgias and neck pain.  Skin: Positive for wound.  Neurological: Positive for light-headedness. Negative for weakness, numbness and headaches.      Allergies  Amoxicillin  Home Medications   Prior to Admission medications   Medication Sig Start Date End Date Taking? Authorizing Provider  albuterol (PROVENTIL) (2.5 MG/3ML) 0.083% nebulizer solution Take 3 mLs (2.5 mg total) by nebulization every 6 (six) hours as needed for wheezing or shortness of breath. 03/14/14  Yes Tonye Pearson, MD  ibuprofen (ADVIL,MOTRIN) 200 MG tablet Take 400 mg by mouth every 6 (six) hours as needed for  moderate pain.   Yes Historical Provider, MD  Multiple Vitamin (MULTIVITAMIN WITH MINERALS) TABS tablet Take 1 tablet by mouth daily.   Yes Historical Provider, MD  azithromycin (ZITHROMAX Z-PAK) 250 MG tablet Take 1 tablet (250 mg total) by mouth daily. Patient not taking: Reported on 05/03/2014 04/02/14   Elpidio Anis, PA-C  benzonatate (TESSALON) 100 MG capsule Take 1 capsule (100 mg total) by mouth 3 (three) times daily as needed for cough. Patient not taking: Reported on 02/02/2015 05/03/14   Cathren Laine, MD  HYDROcodone-homatropine [facility]) 5-1.5 MG/5ML syrup Take 5 mLs by mouth every 6 (six) hours as needed. Patient not taking: Reported on 05/03/2014 03/12/14   Tonye Pearson, MD  predniSONE (DELTASONE) 10 MG tablet Take 6 tablets day 2 (day 1 given in emergency dept.) Take 5 tablets days 3 and 4 Take 4 tablets days 5 and 6 Take 3 tablets days 7 and 8 Take 2 tablets days 9 and 10 Take 1 tablet days 11 and 12 Patient not taking: Reported on 05/03/2014 04/02/14   Melvenia Beam Upstill, PA-C   BP 115/70 mmHg  Pulse 65  Temp(Src) 98 F (36.7 C) (Oral)  Resp 16  SpO2 100%  LMP 01/26/2015 Physical Exam  Constitutional: She is oriented to person, place, and time. She appears well-developed and well-nourished.  HENT:  Head: Normocephalic and atraumatic.  1.5 cm laceration to the parietal scalp area. There is no underlying bony tenderness. No active bleeding  Eyes: Pupils are equal, round, and reactive to light.  Neck: Normal range of motion. Neck  supple.  No pain to the cervical thoracic or lumbosacral spine  Cardiovascular: Normal rate.   Pulmonary/Chest: Effort normal.  Musculoskeletal: She exhibits no edema or tenderness.  Neurological: She is alert and oriented to person, place, and time.  Skin: Skin is warm and dry.  Psychiatric: She has a normal mood and affect.    ED Course  Procedures (including critical care time) Labs Review Labs Reviewed - No data to  display  Imaging Review No results found. I have personally reviewed and evaluated these images and lab results as part of my medical decision-making.   EKG Interpretation None      MDM   Final diagnoses:  Scalp laceration, initial encounter    Wound was repaired with staples by PA-C.  Patient is given wound care instructions. She was encouraged to have staples removed in about 7-10 days. Return precautions were given. She currently doesn't have any signs of a concussion or head injury. She doesn't meet criteria to have imaging done.  She was given head injury precautions.    Rolan Bucco, MD 02/02/15 1053

## 2015-02-02 NOTE — ED Provider Notes (Signed)
LACERATION REPAIR Performed by: Dorthula Matas Authorized by: Dorthula Matas Consent: Verbal consent obtained. Risks and benefits: risks, benefits and alternatives were discussed Consent given by: patient Patient identity confirmed: provided demographic data Prepped and Draped in normal sterile fashion Wound explored  Laceration Location: parietal scalp  Laceration Length: 2 cm  No Foreign Bodies seen or palpated  Anesthesia: EMLA   Irrigation method: syringe Amount of cleaning: standard  Skin closure: staples  Number of sutures: 3  Technique: staples  Patient tolerance: Patient tolerated the procedure well with no immediate complications.  Patient seen by Dr. Fredderick Phenix, please refer to her note for HPI, ROS, PE and disposition.   Marlon Pel, PA-C 02/02/15 1049  Rolan Bucco, MD 02/02/15 1122

## 2015-11-08 ENCOUNTER — Encounter (HOSPITAL_COMMUNITY): Payer: Self-pay | Admitting: Emergency Medicine

## 2015-11-08 ENCOUNTER — Emergency Department (HOSPITAL_COMMUNITY)
Admission: EM | Admit: 2015-11-08 | Discharge: 2015-11-09 | Disposition: A | Discharge status: Home / Self Care | Payer: No Typology Code available for payment source | Attending: Emergency Medicine | Admitting: Emergency Medicine

## 2015-11-08 DIAGNOSIS — S90511A Abrasion, right ankle, initial encounter: Secondary | ICD-10-CM | POA: Diagnosis not present

## 2015-11-08 DIAGNOSIS — M545 Low back pain: Secondary | ICD-10-CM | POA: Insufficient documentation

## 2015-11-08 DIAGNOSIS — Z79899 Other long term (current) drug therapy: Secondary | ICD-10-CM | POA: Insufficient documentation

## 2015-11-08 DIAGNOSIS — M542 Cervicalgia: Secondary | ICD-10-CM | POA: Insufficient documentation

## 2015-11-08 DIAGNOSIS — Y92410 Unspecified street and highway as the place of occurrence of the external cause: Secondary | ICD-10-CM | POA: Diagnosis not present

## 2015-11-08 DIAGNOSIS — Y939 Activity, unspecified: Secondary | ICD-10-CM | POA: Insufficient documentation

## 2015-11-08 DIAGNOSIS — Y999 Unspecified external cause status: Secondary | ICD-10-CM | POA: Insufficient documentation

## 2015-11-08 MED ORDER — IBUPROFEN 800 MG PO TABS
800.0000 mg | ORAL_TABLET | Freq: Once | ORAL | Status: AC
  Administered 2015-11-09: 800 mg via ORAL
  Filled 2015-11-08: qty 1
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
## 2015-11-09 MED ORDER — IBUPROFEN 800 MG PO TABS: 800.0000 mg | ORAL_TABLET | Freq: Three times a day (TID) | ORAL | Status: DC

## 2015-11-09 MED ORDER — METHOCARBAMOL 500 MG PO TABS: 500.0000 mg | ORAL_TABLET | Freq: Two times a day (BID) | ORAL | Status: DC

## 2015-11-09 NOTE — Discharge Instructions (Signed)

## 2016-08-12 ENCOUNTER — Encounter (INDEPENDENT_AMBULATORY_CARE_PROVIDER_SITE_OTHER): Payer: Self-pay | Admitting: Physician Assistant

## 2016-08-12 ENCOUNTER — Ambulatory Visit (INDEPENDENT_AMBULATORY_CARE_PROVIDER_SITE_OTHER): Payer: Self-pay | Admitting: Physician Assistant

## 2016-08-12 VITALS — BP 106/72 | HR 80 | Temp 97.8°F | Ht 64.76 in | Wt 210.8 lb

## 2016-08-12 DIAGNOSIS — R059 Cough, unspecified: Secondary | ICD-10-CM

## 2016-08-12 DIAGNOSIS — R05 Cough: Secondary | ICD-10-CM

## 2016-08-12 DIAGNOSIS — F909 Attention-deficit hyperactivity disorder, unspecified type: Secondary | ICD-10-CM

## 2016-08-12 DIAGNOSIS — Z23 Encounter for immunization: Secondary | ICD-10-CM

## 2016-08-12 MED ORDER — TETANUS-DIPHTH-ACELL PERTUSSIS 5-2.5-18.5 LF-MCG/0.5 IM SUSP
0.5000 mL | Freq: Once | INTRAMUSCULAR | Status: AC
  Administered 2016-08-12: 0.5 mL via INTRAMUSCULAR

## 2016-08-12 MED ORDER — LEVOCETIRIZINE DIHYDROCHLORIDE 5 MG PO TABS: 5.0000 mg | ORAL_TABLET | Freq: Every evening | ORAL | 2 refills | Status: DC

## 2016-08-12 MED ORDER — METHYLPHENIDATE HCL ER (OSM) 36 MG PO TBCR: 36.0000 mg | EXTENDED_RELEASE_TABLET | Freq: Every day | ORAL | 0 refills | Status: DC

## 2016-08-12 NOTE — Patient Instructions (Addendum)
Cough, Adult Coughing is a reflex that clears your throat and your airways. Coughing helps to heal and protect your lungs. It is normal to cough occasionally, but a cough that happens with other symptoms or lasts a long time may be a sign of a condition that needs treatment. A cough may last only 2-3 weeks (acute), or it may last longer than 8 weeks (chronic). What are the causes? Coughing is commonly caused by:  Breathing in substances that irritate your lungs.  A viral or bacterial respiratory infection.  Allergies.  Asthma.  Postnasal drip.  Smoking.  Acid backing up from the stomach into the esophagus (gastroesophageal reflux).  Certain medicines.  Chronic lung problems, including COPD (or rarely, lung cancer).  Other medical conditions such as heart failure.  Follow these instructions at home: Pay attention to any changes in your symptoms. Take these actions to help with your discomfort:  Take medicines only as told by your health care provider. ? If you were prescribed an antibiotic medicine, take it as told by your health care provider. Do not stop taking the antibiotic even if you start to feel better. ? Talk with your health care provider before you take a cough suppressant medicine.  Drink enough fluid to keep your urine clear or pale yellow.  If the air is dry, use a cold steam vaporizer or humidifier in your bedroom or your home to help loosen secretions.  Avoid anything that causes you to cough at work or at home.  If your cough is worse at night, try sleeping in a semi-upright position.  Avoid cigarette smoke. If you smoke, quit smoking. If you need help quitting, ask your health care provider.  Avoid caffeine.  Avoid alcohol.  Rest as needed.  Contact a health care provider if:  You have new symptoms.  You cough up pus.  Your cough does not get better after 2-3 weeks, or your cough gets worse.  You cannot control your cough with suppressant  medicines and you are losing sleep.  You develop pain that is getting worse or pain that is not controlled with pain medicines.  You have a fever.  You have unexplained weight loss.  You have night sweats. Get help right away if:  You cough up blood.  You have difficulty breathing.  Your heartbeat is very fast. This information is not intended to replace advice given to you by your health care provider. Make sure you discuss any questions you have with your health care provider. Document Released: 10/31/2010 Document Revised: 10/10/2015 Document Reviewed: 07/11/2014 Elsevier Interactive Patient Education  2017 Elsevier Inc.  

## 2016-08-12 NOTE — Progress Notes (Signed)
Subjective:  Patient ID: Victoria Marks, female    DOB: 07/11/1990  Age: 26 y.o. MRN: [id number]  CC: establish care  HPI Victoria Marks is a 26 y.o. female with a PMH of ADHD presents for refill of Concerta. Previously took Adderall and Vyvanse. Says that she easily moves off task. She would like to increase her focus for daily living and is working on starting school. Knows she will need pharmacotherapy before she can proceed with applications/testing for school. Also, would like to address chronic congestion and shortness of breath. Has a resolving cough but thinks that pollen season will exacerbate her cough. Described her cough as a bark. No current fever or chills. Endorses no other symptoms.    ROS Review of Systems  Constitutional: Negative for chills, fever and malaise/fatigue.  HENT: Positive for congestion. Negative for ear pain and sinus pain.   Eyes: Negative for pain.  Respiratory: Positive for cough and shortness of breath.   Cardiovascular: Negative for chest pain and palpitations.  Gastrointestinal: Negative for abdominal pain, nausea and vomiting.  Musculoskeletal: Negative for joint pain and myalgias.  Skin: Negative for rash.  Neurological: Negative for headaches.  Psychiatric/Behavioral:       ADHD    Objective:  BP 106/72 (BP Location: Left Arm, Patient Position: Sitting, Cuff Size: Normal)   Pulse 80   Temp 97.8 F (36.6 C) (Oral)   Ht 5' 4.76" (1.645 m)   Wt 210 lb 12.8 oz (95.6 kg)   LMP 06/16/2016 (Approximate)   SpO2 98%   BMI 35.34 kg/m   BP/Weight 08/12/2016 11/08/2015 02/02/2015  Systolic BP 106 117 102  Diastolic BP 72 90 75  Wt. (Lbs) 210.8 - -  BMI 35.34 - -      Physical Exam  Constitutional: She is oriented to person, place, and time.  Well developed, well nourished, NAD, polite, talkative  HENT:  Head: Normocephalic and atraumatic.  TMs normal. Turbinates hypertrophic and erythematous. Oropharynx normal. No sinus TTP  Eyes: No  scleral icterus.  Neck: Normal range of motion. Neck supple. No thyromegaly present.  Cardiovascular: Normal rate, regular rhythm and normal heart sounds.   Pulmonary/Chest: Effort normal and breath sounds normal.  Abdominal: Soft. Bowel sounds are normal. There is no tenderness.  Musculoskeletal: She exhibits no edema.  Lymphadenopathy:    She has no cervical adenopathy.  Neurological: She is alert and oriented to person, place, and time. No cranial nerve deficit. Coordination normal.  Skin: Skin is warm and dry. No rash noted. No erythema. No pallor.  Psychiatric: She has a normal mood and affect. Thought content normal.  Pt very talkative, with somewhat increased rate of speech. Minimal attention span.   Vitals reviewed.    Assessment & Plan:   1. Attention deficit hyperactivity disorder (ADHD), unspecified ADHD type - TSH - methylphenidate (CONCERTA) 36 MG PO CR tablet; Take 1 tablet (36 mg total) by mouth daily.  Dispense: 30 tablet; Refill: 0 - Psychiatry referral  2. Cough - suspected environmental allergies. Will proceed with allergy testing if symptoms worsen or patient fails conventional therapy. - DG Chest 2 View; Future - CBC with Differential - Comprehensive metabolic panel - TSH - PPD; Future   Meds ordered this encounter  Medications  . methylphenidate (CONCERTA) 36 MG PO CR tablet    Sig: Take 1 tablet (36 mg total) by mouth daily.    Dispense:  30 tablet    Refill:  0    Order Specific Question:  Supervising Provider    Answer:   Quentin Angst [[id number]]    Follow-up: Return in about 4 weeks (around 09/09/2016) for ADD.   Loletta Specter PA

## 2016-08-13 LAB — CBC WITH DIFFERENTIAL/PLATELET
BASOS: 0 %
Basophils Absolute: 0 10*3/uL (ref 0.0–0.2)
EOS (ABSOLUTE): 0.2 10*3/uL (ref 0.0–0.4)
EOS: 3 %
HEMATOCRIT: 38.2 % (ref 34.0–46.6)
Hemoglobin: 12 g/dL (ref 11.1–15.9)
IMMATURE GRANULOCYTES: 0 %
Immature Grans (Abs): 0 10*3/uL (ref 0.0–0.1)
LYMPHS ABS: 2.2 10*3/uL (ref 0.7–3.1)
LYMPHS: 31 %
MCH: 25.3 pg — ABNORMAL LOW (ref 26.6–33.0)
MCHC: 31.4 g/dL — ABNORMAL LOW (ref 31.5–35.7)
MCV: 80 fL (ref 79–97)
MONOCYTES: 7 %
MONOS ABS: 0.5 10*3/uL (ref 0.1–0.9)
Neutrophils Absolute: 4.2 10*3/uL (ref 1.4–7.0)
Neutrophils: 59 %
Platelets: 296 10*3/uL (ref 150–379)
RBC: 4.75 x10E6/uL (ref 3.77–5.28)
RDW: 21.6 % — AB (ref 12.3–15.4)
WBC: 7.2 10*3/uL (ref 3.4–10.8)

## 2016-08-13 LAB — COMPREHENSIVE METABOLIC PANEL
A/G RATIO: 1.5 (ref 1.2–2.2)
ALBUMIN: 4.2 g/dL (ref 3.5–5.5)
ALT: 11 IU/L (ref 0–32)
AST: 14 IU/L (ref 0–40)
Alkaline Phosphatase: 75 IU/L (ref 39–117)
BUN / CREAT RATIO: 16 (ref 9–23)
BUN: 16 mg/dL (ref 6–20)
Bilirubin Total: 0.3 mg/dL (ref 0.0–1.2)
CALCIUM: 9.4 mg/dL (ref 8.7–10.2)
CO2: 20 mmol/L (ref 18–29)
Chloride: 103 mmol/L (ref 96–106)
Creatinine, Ser: 1.03 mg/dL — ABNORMAL HIGH (ref 0.57–1.00)
GFR, EST AFRICAN AMERICAN: 87 mL/min/{1.73_m2} (ref >59)
GFR, EST NON AFRICAN AMERICAN: 75 mL/min/{1.73_m2} (ref >59)
GLOBULIN, TOTAL: 2.8 g/dL (ref 1.5–4.5)
Glucose: 83 mg/dL (ref 65–99)
POTASSIUM: 4.3 mmol/L (ref 3.5–5.2)
SODIUM: 141 mmol/L (ref 134–144)
Total Protein: 7 g/dL (ref 6.0–8.5)

## 2016-08-13 LAB — TSH: TSH: 1.82 u[IU]/mL (ref 0.450–4.500)

## 2016-08-17 ENCOUNTER — Ambulatory Visit (INDEPENDENT_AMBULATORY_CARE_PROVIDER_SITE_OTHER): Payer: Self-pay | Admitting: Physician Assistant

## 2016-08-24 ENCOUNTER — Ambulatory Visit (INDEPENDENT_AMBULATORY_CARE_PROVIDER_SITE_OTHER): Payer: Self-pay | Admitting: Physician Assistant

## 2019-03-18 ENCOUNTER — Encounter (HOSPITAL_COMMUNITY): Payer: Self-pay | Admitting: Behavioral Health

## 2019-03-18 ENCOUNTER — Observation Stay (HOSPITAL_COMMUNITY)
Admission: RE | Admit: 2019-03-18 | Discharge: 2019-03-19 | Disposition: A | Discharge status: Home / Self Care | Payer: Self-pay | Attending: Family | Admitting: Family

## 2019-03-18 ENCOUNTER — Other Ambulatory Visit: Payer: Self-pay

## 2019-03-18 DIAGNOSIS — F909 Attention-deficit hyperactivity disorder, unspecified type: Secondary | ICD-10-CM | POA: Insufficient documentation

## 2019-03-18 DIAGNOSIS — F329 Major depressive disorder, single episode, unspecified: Secondary | ICD-10-CM | POA: Insufficient documentation

## 2019-03-18 DIAGNOSIS — Z79899 Other long term (current) drug therapy: Secondary | ICD-10-CM | POA: Insufficient documentation

## 2019-03-18 DIAGNOSIS — F4325 Adjustment disorder with mixed disturbance of emotions and conduct: Principal | ICD-10-CM

## 2019-03-18 DIAGNOSIS — Z20828 Contact with and (suspected) exposure to other viral communicable diseases: Secondary | ICD-10-CM | POA: Insufficient documentation

## 2019-03-18 DIAGNOSIS — F419 Anxiety disorder, unspecified: Secondary | ICD-10-CM | POA: Diagnosis present

## 2019-03-18 LAB — SARS CORONAVIRUS 2 BY RT PCR (HOSPITAL ORDER, PERFORMED IN ~~LOC~~ HOSPITAL LAB): SARS Coronavirus 2: NEGATIVE

## 2019-03-18 MED ORDER — HYDROXYZINE HCL 25 MG PO TABS
25.0000 mg | ORAL_TABLET | Freq: Three times a day (TID) | ORAL | Status: DC | PRN
  Administered 2019-03-18: 25 mg via ORAL
  Filled 2019-03-18: qty 1

## 2019-03-18 MED ORDER — ACETAMINOPHEN 325 MG PO TABS: 650.0000 mg | ORAL_TABLET | Freq: Four times a day (QID) | ORAL | Status: DC | PRN

## 2019-03-18 MED ORDER — MAGNESIUM HYDROXIDE 400 MG/5ML PO SUSP: 30.0000 mL | Freq: Every day | ORAL | Status: DC | PRN

## 2019-03-18 MED ORDER — ALUM & MAG HYDROXIDE-SIMETH 200-200-20 MG/5ML PO SUSP: 30.0000 mL | ORAL | Status: DC | PRN

## 2019-03-18 MED ORDER — TRAZODONE HCL 100 MG PO TABS
100.0000 mg | ORAL_TABLET | Freq: Every evening | ORAL | Status: DC | PRN
  Administered 2019-03-18: 100 mg via ORAL
  Filled 2019-03-18: qty 1
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
## 2019-03-18 NOTE — H&P (Signed)
Behavioral Health Medical Screening Exam  Victoria Marks is an 28 y.o. female with delusions and paranoia. She presents with her family who have concerns that patient has been behaving strangely. Her family reports she has barricaded herself in the house and she has been threatening her family members. Patient denies this at this time, however is open to staying in the hospital for observation status to montior her for these behaviors.   Total Time spent with patient: 20 minutes  Psychiatric Specialty Exam: Physical Exam  ROS  There were no vitals taken for this visit.There is no height or weight on file to calculate BMI.  General Appearance: Fairly Groomed  Eye Contact:  Fair  Speech:  Clear and Coherent and Normal Rate  Volume:  Normal  Mood:  Anxious  Affect:  Appropriate and Congruent  Thought Process:  Coherent, Linear and Descriptions of Associations: Intact  Orientation:  Full (Time, Place, and Person)  Thought Content:  Logical  Suicidal Thoughts:  No  Homicidal Thoughts:  No  Memory:  Immediate;   Fair Recent;   Fair  Judgement:  Fair  Insight:  Shallow  Psychomotor Activity:  Increased  Concentration: Concentration: Fair and Attention Span: Fair  Recall:  [employer] of Knowledge:Fair  Language: Fair  Akathisia:  No  Handed:  Right  AIMS (if indicated):     Assets:  Communication Skills Desire for Improvement Financial Resources/Insurance Leisure Time Physical Health  Sleep:       Musculoskeletal: Strength & Muscle Tone: within normal limits Gait & Station: normal Patient leans: N/A  There were no vitals taken for this visit.  Recommendations:  Based on my evaluation the patient does not appear to have an emergency medical condition. will admit to obs unit, pending covid testing   Suella Broad, FNP 03/18/2019, 12:00 PM

## 2019-03-18 NOTE — Plan of Care (Signed)
[name] Crisis Plan  Reason for Crisis Plan:  Crisis Stabilization   Plan of Care:  Referral for Telepsychiatry/Psychiatric Consult  Family Support:      Current Living Environment:  Living Arrangements: Other relatives  Insurance:   Hospital Account    Name Acct ID Class Status Primary Coverage   Victoria Marks, Victoria Marks [id number] [place] Open None        Guarantor Account (for Hospital Account [id number])    Name Relation to Pt Service Area Active? Acct Type   Denice Paradise T Self [facility] Yes Behavioral Health   Address Phone       [place], [place] [address] [phone])          Coverage Information (for Hospital Account [id number])    Not on file      Legal Guardian:  Legal Guardian: Other:(self)  Primary Care Provider:  Clent Demark, PA-C  Current Outpatient Providers:  Patient denies states "I pray"  Psychiatrist:  Name of Psychiatrist: none  Counselor/Therapist:  Name of Therapist: none  Compliant with Medications:  No  Additional Information:   Victoria Marks 10/31/20204:40 PM
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
## 2019-03-18 NOTE — Progress Notes (Signed)
Patient ID: Victoria Marks, female   DOB: 1990-08-27, 28 y.o.   MRN: [id number] Pt A&O x 4, resting in room at present.  No distress noted, calm & cooperative at present.  Denies SI, HI or AVH.  Monitoring for safety.

## 2019-03-18 NOTE — Progress Notes (Signed)
   03/18/19 1330  Charting Type  Charting Type Initial  Orders Chart Check (once per shift) Completed  Neurological  Neuro (WDL) WDL  Level of Consciousness Alert  Orientation Level Oriented X4  Cognition Follows commands  Speech Clear  HEENT  HEENT (WDL) WDL  Respiratory  Respiratory (WDL) WDL  Respiratory Pattern Regular;Unlabored  Chest Assessment Chest expansion symmetrical  Cardiac  Cardiac (WDL) WDL  Vascular  Vascular (WDL) WDL  Braden Scale (Ages 8 and up)  Sensory Perceptions 4  Moisture 4  Activity 4  Mobility 4  Nutrition 3  Friction and Shear 3  Braden Scale Score 22  Musculoskeletal  Musculoskeletal (WDL) WDL  Assistive Device None  Gastrointestinal  Gastrointestinal (WDL) WDL  Last BM Date 03/18/19  GU Assessment  Genitourinary (WDL) WDL

## 2019-03-19 DIAGNOSIS — F4325 Adjustment disorder with mixed disturbance of emotions and conduct: Secondary | ICD-10-CM

## 2019-03-19 MED ORDER — HYDROXYZINE HCL 25 MG PO TABS: 25.0000 mg | ORAL_TABLET | Freq: Three times a day (TID) | ORAL | 0 refills | Status: DC | PRN

## 2019-03-19 MED ORDER — TRAZODONE HCL 100 MG PO TABS: 100.0000 mg | ORAL_TABLET | Freq: Every evening | ORAL | 0 refills | Status: DC | PRN

## 2019-03-19 MED ORDER — HYDROXYZINE HCL 25 MG PO TABS: 25.0000 mg | ORAL_TABLET | Freq: Three times a day (TID) | ORAL | 0 refills | Status: AC | PRN

## 2019-03-19 MED ORDER — TRAZODONE HCL 100 MG PO TABS: 100.0000 mg | ORAL_TABLET | Freq: Every evening | ORAL | 0 refills | Status: AC | PRN
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
## 2019-03-19 NOTE — Progress Notes (Signed)
Discharge instructions reviewed with patient at length. Patient denies any SI/HI or audiory/visual hallucinations. No signs of acute decompensation. Pt verbalized understanding of follow up plan, suicide prevention hotline and crisis care. Patients belongings were returned. Pt discharged ambulatory, in no acute distress to the care of her mother.

## 2023-11-10 ENCOUNTER — Encounter (HOSPITAL_COMMUNITY): Payer: Self-pay | Admitting: *Deleted

## 2023-11-10 ENCOUNTER — Other Ambulatory Visit: Payer: Self-pay

## 2023-11-10 ENCOUNTER — Emergency Department (HOSPITAL_COMMUNITY)
Admission: EM | Admit: 2023-11-10 | Discharge: 2023-11-11 | Disposition: A | Discharge status: Home / Self Care | Payer: Self-pay | Attending: Emergency Medicine | Admitting: Emergency Medicine

## 2023-11-10 DIAGNOSIS — W268XXA Contact with other sharp object(s), not elsewhere classified, initial encounter: Secondary | ICD-10-CM | POA: Insufficient documentation

## 2023-11-10 DIAGNOSIS — S61215A Laceration without foreign body of left ring finger without damage to nail, initial encounter: Secondary | ICD-10-CM | POA: Insufficient documentation

## 2023-11-10 NOTE — ED Triage Notes (Addendum)
 Pt says she was trying to grab scissors and cut her finger, around 1 hour ago, last tetanus around 4 years ago. Laceration to the tip of the left hand second finger. New dressing applied

## 2023-11-11 MED ORDER — ACETAMINOPHEN 500 MG PO TABS
1000.0000 mg | ORAL_TABLET | Freq: Once | ORAL | Status: AC
  Administered 2023-11-11: 1000 mg via ORAL
  Filled 2023-11-11: qty 2

## 2023-11-11 MED ORDER — LIDOCAINE HCL (PF) 1 % IJ SOLN
30.0000 mL | Freq: Once | INTRAMUSCULAR | Status: AC
  Administered 2023-11-11: 30 mL
  Filled 2023-11-11: qty 30

## 2023-11-11 NOTE — ED Provider Notes (Signed)
 [place] EMERGENCY DEPARTMENT AT [facility] Provider Note   CSN: [id number] Arrival date & time: 11/10/23  1941     Patient presents with: Extremity Laceration   Victoria Marks is a 33 y.o. female presents to ED for evaluation of laceration to top of 4th finger of left hand.  She reports that she was attempting to grab scissors and cut herself.  She reports her last tetanus update was 4 years ago.  She denies any history of hand surgery.  Denies any other concerns.   HPI     Prior to Admission medications   Medication Sig Start Date End Date Taking? Authorizing Provider  albuterol  (PROVENTIL ) (2.5 MG/3ML) 0.083% nebulizer solution Take 3 mLs (2.5 mg total) by nebulization every 6 (six) hours as needed for wheezing or shortness of breath. 03/14/14   Joylene Lamar SQUIBB, MD  hydrOXYzine  (ATARAX /VISTARIL ) 25 MG tablet Take 1 tablet (25 mg total) by mouth 3 (three) times daily as needed for anxiety. 03/19/19   Moishe Bernadette BRAVO, NP  traZODone  (DESYREL ) 100 MG tablet Take 1 tablet (100 mg total) by mouth at bedtime as needed for sleep. 03/19/19   Moishe Bernadette BRAVO, NP    Allergies: Amoxicillin     Review of Systems  Skin:  Positive for wound.  All other systems reviewed and are negative.   Updated Vital Signs BP (!) 140/92   Pulse 100   Temp 97.7 F (36.5 C) (Oral)   Resp 18   LMP 09/16/2023   SpO2 100%   Physical Exam Vitals and nursing note reviewed.  Constitutional:      General: She is not in acute distress.    Appearance: She is well-developed.  HENT:     Head: Normocephalic and atraumatic.   Eyes:     Conjunctiva/sclera: Conjunctivae normal.    Cardiovascular:     Rate and Rhythm: Normal rate and regular rhythm.     Heart sounds: No murmur heard. Pulmonary:     Effort: Pulmonary effort is normal. No respiratory distress.     Breath sounds: Normal breath sounds.  Abdominal:     Palpations: Abdomen is soft.     Tenderness: There is no abdominal  tenderness.   Musculoskeletal:        General: No swelling.     Cervical back: Neck supple.   Skin:    General: Skin is warm and dry.     Capillary Refill: Capillary refill takes less than 2 seconds.     Comments: Laceration to distal tip of left 4th finger.  Full ROM of wrist, all digits of left hand.  No tendon involvement.  Brisk cap refill.  No hemorrhaging.   Neurological:     Mental Status: She is alert.   Psychiatric:        Mood and Affect: Mood normal.     (all labs ordered are listed, but only abnormal results are displayed) Labs Reviewed - No data to display  EKG: None  Radiology: No results found.   .Laceration Repair  Date/Time: 11/11/2023 4:27 AM  Performed by: Ruthell Lonni FALCON, PA-C Authorized by: Ruthell Lonni FALCON, PA-C   Consent:    Consent obtained:  Verbal   Consent given by:  Patient   Risks, benefits, and alternatives were discussed: yes     Risks discussed:  Infection, need for additional repair and nerve damage   Alternatives discussed:  No treatment Universal protocol:    Patient identity confirmed:  Verbally with patient and  arm band Anesthesia:    Anesthesia method:  Local infiltration   Local anesthetic:  Lidocaine  1% w/o epi Laceration details:    Location:  Finger   Finger location:  L ring finger   Length (cm):  5 Treatment:    Area cleansed with:  Saline   Amount of cleaning:  Standard   Irrigation solution:  Sterile saline Skin repair:    Repair method:  Sutures   Suture size:  5-0   Suture material:  Prolene   Suture technique:  Simple interrupted   Number of sutures:  5 Approximation:    Approximation:  Close Repair type:    Repair type:  Simple Post-procedure details:    Dressing:  Antibiotic ointment and bulky dressing   Procedure completion:  Tolerated well, no immediate complications    Medications Ordered in the ED  lidocaine  (PF) (XYLOCAINE ) 1 % injection 30 mL (30 mLs Other Given by Other 11/11/23  9660)    Medical Decision Making Risk Prescription drug management.   33 year old female presents for evaluation.  Please see HPI for further details.  Patient presents with laceration of distal left fourth finger.  Laceration closed per procedure note.  No tendon involvement.  Full ROM of all digits of the left hand.  Full ROM of left wrist.  Brisk cap refill.  Neurovascularly intact.  Tetanus up-to-date.  Patient wound closed.  Will follow-up in 7 to 10 days for removal of sutures in ED, urgent care or PCP.  Return precautions given and voiced understanding.  Stable to discharge.     Final diagnoses:  Laceration of left ring finger without foreign body without damage to nail, initial encounter    ED Discharge Orders     None          Ruthell Lonni JULIANNA DEVONNA 11/11/23 0430    Griselda Norris, MD 11/11/23 [phone]

## 2023-11-11 NOTE — Discharge Instructions (Signed)
 Please take ibuprofen  or Tylenol  for pain.  Please follow-up in 7 to 10 days at PCP, urgent care or this ED for removal of sutures.  Do not submerge your wound into water.  Please read attached guide concerning laceration care.

## 2023-11-26 ENCOUNTER — Encounter (HOSPITAL_COMMUNITY): Payer: Self-pay

## 2023-11-26 ENCOUNTER — Emergency Department (HOSPITAL_COMMUNITY)
Admission: EM | Admit: 2023-11-26 | Discharge: 2023-11-26 | Disposition: A | Discharge status: Home / Self Care | Payer: Self-pay | Attending: Emergency Medicine | Admitting: Emergency Medicine

## 2023-11-26 ENCOUNTER — Other Ambulatory Visit: Payer: Self-pay

## 2023-11-26 DIAGNOSIS — Z4802 Encounter for removal of sutures: Secondary | ICD-10-CM

## 2023-11-26 DIAGNOSIS — Z4800 Encounter for change or removal of nonsurgical wound dressing: Secondary | ICD-10-CM | POA: Insufficient documentation

## 2023-11-26 NOTE — Discharge Instructions (Signed)
 As we discussed, your sutures were removed in the emergency department today.  Does look like the wound opened up some from when it was stitched, it will need to continue to heal on its own.  I recommend that you keep this covered with a dressing until it is completely healed.  You can clean it regularly with soap and water.  Return if development of any significant pain, swelling, yellow drainage, or any new or worsening symptoms.

## 2023-11-26 NOTE — ED Provider Notes (Signed)
 [place] EMERGENCY DEPARTMENT AT [facility] Provider Note   CSN: [id number] Arrival date & time: 11/26/23  1227     Patient presents with: Wound Check   Victoria Marks is a 33 y.o. female.   Patient with noncontributory past medical history presents today requesting suture removal and wound check. Reports on 6/25 she cut her left ring finger and was seen here and had 5 sutures placed. She was told to follow-up in 1 week, however was unable to come in until now. Reports that 2 of the sutures already came out on their own and after the wound did split open and she had some bleeding however this has since resolved. Presents to have the rest of her sutures removed today.  Denies any pain in the finger, no drainage, no fevers or chills.  The history is provided by the patient. No language interpreter was used.  Wound Check       Prior to Admission medications   Medication Sig Start Date End Date Taking? Authorizing Provider  albuterol  (PROVENTIL ) (2.5 MG/3ML) 0.083% nebulizer solution Take 3 mLs (2.5 mg total) by nebulization every 6 (six) hours as needed for wheezing or shortness of breath. 03/14/14   Joylene Lamar SQUIBB, MD  hydrOXYzine  (ATARAX /VISTARIL ) 25 MG tablet Take 1 tablet (25 mg total) by mouth 3 (three) times daily as needed for anxiety. 03/19/19   Moishe Bernadette BRAVO, NP  traZODone  (DESYREL ) 100 MG tablet Take 1 tablet (100 mg total) by mouth at bedtime as needed for sleep. 03/19/19   Moishe Bernadette BRAVO, NP    Allergies: Amoxicillin     Review of Systems  Skin:  Positive for wound.  All other systems reviewed and are negative.   Updated Vital Signs BP 112/68 (BP Location: Right Arm)   Pulse 83   Temp 97.6 F (36.4 C) (Oral)   Resp 16   Ht 5' 4 (1.626 m)   Wt 95.4 kg   LMP 09/16/2023   SpO2 95%   BMI 36.10 kg/m   Physical Exam Vitals and nursing note reviewed.  Constitutional:      General: She is not in acute distress.    Appearance: Normal  appearance. She is normal weight. She is not ill-appearing, toxic-appearing or diaphoretic.  HENT:     Head: Normocephalic and atraumatic.  Cardiovascular:     Rate and Rhythm: Normal rate.  Pulmonary:     Effort: Pulmonary effort is normal. No respiratory distress.  Musculoskeletal:        General: Normal range of motion.     Cervical back: Normal range of motion.  Skin:    General: Skin is warm and dry.     Comments: Distal palmar aspect of the left ring finger with wound present that does appear to have dehisced, 3 sutures still in place. Good granulation tissue present inside the wound.  No tenderness to palpation, no erythema, warmth, swelling, fluctuance, induration, or purulence.  Good capillary refill.  No bleeding.  Neurological:     General: No focal deficit present.     Mental Status: She is alert.  Psychiatric:        Mood and Affect: Mood normal.        Behavior: Behavior normal.     (all labs ordered are listed, but only abnormal results are displayed) Labs Reviewed - No data to display  EKG: None  Radiology: No results found.   Suture Removal  Date/Time: 11/26/2023 1:13 PM  Performed by: Josely Moffat  A, PA-C Authorized by: Nora Lauraine LABOR, PA-C   Consent:    Consent obtained:  Verbal   Consent given by:  Patient   Risks, benefits, and alternatives were discussed: yes     Risks discussed:  Bleeding, pain and wound separation   Alternatives discussed:  No treatment, delayed treatment, alternative treatment, observation and referral Universal protocol:    Procedure explained and questions answered to patient or proxy's satisfaction: yes     Patient identity confirmed:  Verbally with patient Location:    Location:  Upper extremity   Upper extremity location:  Hand   Hand location:  L ring finger Procedure details:    Wound appearance:  No signs of infection, clean and pink   Number of sutures removed:  3 Post-procedure details:    Post-removal:   Antibiotic ointment applied   Procedure completion:  Tolerated well, no immediate complications    Medications Ordered in the ED - No data to display                                  Medical Decision Making  Staple removal   Pt to ER for staple/suture removal and wound check as above.  She is afebrile, nontoxic-appearing, and in no acute distress reassuring vital signs.  Procedure tolerated well per procedure above. Vitals normal, no signs of infection.  The wound did appear to dehisce, however is otherwise healing well with good granulation tissue.  Ccar minimization & return precautions given. Evaluation and diagnostic testing in the emergency department does not suggest an emergent condition requiring admission or immediate intervention beyond what has been performed at this time.  Plan for discharge with close PCP follow-up.  Patient is understanding and amenable with plan, educated on red flag symptoms that would prompt immediate return.  Patient discharged in stable condition.  Final diagnoses:  Visit for suture removal    ED Discharge Orders     None     An After Visit Summary was printed and given to the patient.      Nora Lauraine LABOR DEVONNA 11/26/23 1317    Suzette Pac, MD 11/27/23 [phone]

## 2023-11-26 NOTE — ED Triage Notes (Signed)
 Pt arrived reporting stitches fell out on finger. Has had it in for 1 week now. Denies any new pain, swelling or any other concerns. Wants to get it checked.
